# Patient Record
Sex: Male | Born: 1950 | Hispanic: No
Health system: Southern US, Community
[De-identification: ages and names within clinical notes are randomized; demographics above are authoritative.]

---

## 2016-08-14 ENCOUNTER — Inpatient Hospital Stay
Admission: AD | Admit: 2016-08-14 | Discharge: 2016-10-29 | Disposition: E | Payer: Self-pay | Source: Ambulatory Visit | Attending: Internal Medicine | Admitting: Internal Medicine

## 2016-08-14 ENCOUNTER — Other Ambulatory Visit (HOSPITAL_COMMUNITY): Payer: Self-pay

## 2016-08-14 DIAGNOSIS — Z4659 Encounter for fitting and adjustment of other gastrointestinal appliance and device: Secondary | ICD-10-CM

## 2016-08-14 DIAGNOSIS — Z9889 Other specified postprocedural states: Secondary | ICD-10-CM

## 2016-08-14 DIAGNOSIS — R1310 Dysphagia, unspecified: Secondary | ICD-10-CM

## 2016-08-14 DIAGNOSIS — Z931 Gastrostomy status: Secondary | ICD-10-CM

## 2016-08-14 DIAGNOSIS — J9690 Respiratory failure, unspecified, unspecified whether with hypoxia or hypercapnia: Secondary | ICD-10-CM

## 2016-08-14 DIAGNOSIS — Z0189 Encounter for other specified special examinations: Secondary | ICD-10-CM

## 2016-08-14 DIAGNOSIS — J189 Pneumonia, unspecified organism: Secondary | ICD-10-CM

## 2016-08-14 DIAGNOSIS — T17908A Unspecified foreign body in respiratory tract, part unspecified causing other injury, initial encounter: Secondary | ICD-10-CM

## 2016-08-14 DIAGNOSIS — J90 Pleural effusion, not elsewhere classified: Secondary | ICD-10-CM

## 2016-08-14 DIAGNOSIS — S27392A Other injuries of lung, bilateral, initial encounter: Secondary | ICD-10-CM

## 2016-08-14 LAB — BLOOD GAS, ARTERIAL
Acid-Base Excess: 0.2 mmol/L (ref 0.0–2.0)
Bicarbonate: 23.5 mmol/L (ref 20.0–28.0)
FIO2: 40
O2 SAT: 90.7 %
PEEP: 5 cmH2O
PH ART: 7.469 — AB (ref 7.350–7.450)
Patient temperature: 98.6
Pressure support: 15 cmH2O
RATE: 22 resp/min
pCO2 arterial: 32.8 mmHg (ref 32.0–48.0)
pO2, Arterial: 57.3 mmHg — ABNORMAL LOW (ref 83.0–108.0)

## 2016-08-15 LAB — COMPREHENSIVE METABOLIC PANEL
ALT: 27 U/L (ref 17–63)
AST: 51 U/L — AB (ref 15–41)
Albumin: 1.9 g/dL — ABNORMAL LOW (ref 3.5–5.0)
Alkaline Phosphatase: 98 U/L (ref 38–126)
Anion gap: 5 (ref 5–15)
BUN: 25 mg/dL — AB (ref 6–20)
CHLORIDE: 116 mmol/L — AB (ref 101–111)
CO2: 22 mmol/L (ref 22–32)
CREATININE: 0.64 mg/dL (ref 0.61–1.24)
Calcium: 9.2 mg/dL (ref 8.9–10.3)
GFR calc non Af Amer: >60 mL/min (ref >60)
Glucose, Bld: 55 mg/dL — ABNORMAL LOW (ref 65–99)
POTASSIUM: 4.8 mmol/L (ref 3.5–5.1)
SODIUM: 143 mmol/L (ref 135–145)
Total Bilirubin: 4.2 mg/dL — ABNORMAL HIGH (ref 0.3–1.2)
Total Protein: 5.8 g/dL — ABNORMAL LOW (ref 6.5–8.1)

## 2016-08-15 LAB — CBC
HEMATOCRIT: 35.5 % — AB (ref 39.0–52.0)
Hemoglobin: 11.3 g/dL — ABNORMAL LOW (ref 13.0–17.0)
MCH: 29.1 pg (ref 26.0–34.0)
MCHC: 31.8 g/dL (ref 30.0–36.0)
MCV: 91.5 fL (ref 78.0–100.0)
PLATELETS: 29 10*3/uL — AB (ref 150–400)
RBC: 3.88 MIL/uL — AB (ref 4.22–5.81)
RDW: 21.4 % — ABNORMAL HIGH (ref 11.5–15.5)
WBC: 9.1 10*3/uL (ref 4.0–10.5)

## 2016-08-17 ENCOUNTER — Other Ambulatory Visit (HOSPITAL_COMMUNITY): Payer: Self-pay

## 2016-08-17 LAB — CBC WITH DIFFERENTIAL/PLATELET
BASOS ABS: 0 10*3/uL (ref 0.0–0.1)
Basophils Relative: 0 %
EOS ABS: 0.3 10*3/uL (ref 0.0–0.7)
Eosinophils Relative: 3 %
HCT: 33.9 % — ABNORMAL LOW (ref 39.0–52.0)
HEMOGLOBIN: 11.1 g/dL — AB (ref 13.0–17.0)
LYMPHS PCT: 9 %
Lymphs Abs: 0.9 10*3/uL (ref 0.7–4.0)
MCH: 30.2 pg (ref 26.0–34.0)
MCHC: 32.7 g/dL (ref 30.0–36.0)
MCV: 92.4 fL (ref 78.0–100.0)
MONOS PCT: 19 %
Monocytes Absolute: 1.9 10*3/uL — ABNORMAL HIGH (ref 0.1–1.0)
NEUTROS ABS: 6.8 10*3/uL (ref 1.7–7.7)
Neutrophils Relative %: 69 %
Platelets: 40 10*3/uL — ABNORMAL LOW (ref 150–400)
RBC: 3.67 MIL/uL — AB (ref 4.22–5.81)
RDW: 22.3 % — AB (ref 11.5–15.5)
WBC: 9.9 10*3/uL (ref 4.0–10.5)

## 2016-08-17 LAB — CULTURE, RESPIRATORY

## 2016-08-17 LAB — CULTURE, RESPIRATORY W GRAM STAIN

## 2016-08-18 LAB — BLOOD GAS, ARTERIAL
ACID-BASE DEFICIT: 3.6 mmol/L — AB (ref 0.0–2.0)
BICARBONATE: 21.4 mmol/L (ref 20.0–28.0)
FIO2: 40
LHR: 14 {breaths}/min
MECHVT: 450 mL
O2 SAT: 97.4 %
PATIENT TEMPERATURE: 98.1
PCO2 ART: 41.6 mmHg (ref 32.0–48.0)
PEEP/CPAP: 5 cmH2O
PH ART: 7.331 — AB (ref 7.350–7.450)
PO2 ART: 98.6 mmHg (ref 83.0–108.0)

## 2016-08-19 MED FILL — Medication: Qty: 1 | Status: AC

## 2016-08-21 LAB — COMPREHENSIVE METABOLIC PANEL
ALT: 47 U/L (ref 17–63)
ANION GAP: 6 (ref 5–15)
AST: 112 U/L — ABNORMAL HIGH (ref 15–41)
Albumin: 1.6 g/dL — ABNORMAL LOW (ref 3.5–5.0)
Alkaline Phosphatase: 162 U/L — ABNORMAL HIGH (ref 38–126)
BUN: 23 mg/dL — ABNORMAL HIGH (ref 6–20)
CO2: 22 mmol/L (ref 22–32)
Calcium: 9.2 mg/dL (ref 8.9–10.3)
Chloride: 110 mmol/L (ref 101–111)
Creatinine, Ser: 0.59 mg/dL — ABNORMAL LOW (ref 0.61–1.24)
GFR calc Af Amer: >60 mL/min (ref >60)
GFR calc non Af Amer: >60 mL/min (ref >60)
Glucose, Bld: 91 mg/dL (ref 65–99)
POTASSIUM: 4.7 mmol/L (ref 3.5–5.1)
SODIUM: 138 mmol/L (ref 135–145)
TOTAL PROTEIN: 6.3 g/dL — AB (ref 6.5–8.1)
Total Bilirubin: 5.3 mg/dL — ABNORMAL HIGH (ref 0.3–1.2)

## 2016-08-21 LAB — CBC
HCT: 29.3 % — ABNORMAL LOW (ref 39.0–52.0)
Hemoglobin: 9.9 g/dL — ABNORMAL LOW (ref 13.0–17.0)
MCH: 30.7 pg (ref 26.0–34.0)
MCHC: 33.8 g/dL (ref 30.0–36.0)
MCV: 91 fL (ref 78.0–100.0)
Platelets: 50 10*3/uL — ABNORMAL LOW (ref 150–400)
RBC: 3.22 MIL/uL — ABNORMAL LOW (ref 4.22–5.81)
RDW: 22.3 % — AB (ref 11.5–15.5)
WBC: 11 10*3/uL — ABNORMAL HIGH (ref 4.0–10.5)

## 2016-08-21 LAB — AMMONIA: Ammonia: 69 umol/L — ABNORMAL HIGH (ref 9–35)

## 2016-08-24 DIAGNOSIS — K921 Melena: Secondary | ICD-10-CM

## 2016-08-24 DIAGNOSIS — D62 Acute posthemorrhagic anemia: Secondary | ICD-10-CM

## 2016-08-24 DIAGNOSIS — K7469 Other cirrhosis of liver: Secondary | ICD-10-CM

## 2016-08-24 LAB — CBC
HEMATOCRIT: 24.9 % — AB (ref 39.0–52.0)
HEMATOCRIT: 20 % — AB (ref 39.0–52.0)
Hemoglobin: 8.5 g/dL — ABNORMAL LOW (ref 13.0–17.0)
Hemoglobin: 6.9 g/dL — CL (ref 13.0–17.0)
MCH: 30.6 pg (ref 26.0–34.0)
MCH: 30.4 pg (ref 26.0–34.0)
MCHC: 34.1 g/dL (ref 30.0–36.0)
MCHC: 34.5 g/dL (ref 30.0–36.0)
MCV: 89.6 fL (ref 78.0–100.0)
MCV: 88.1 fL (ref 78.0–100.0)
PLATELETS: 79 10*3/uL — AB (ref 150–400)
PLATELETS: 100 10*3/uL — AB (ref 150–400)
RBC: 2.78 MIL/uL — AB (ref 4.22–5.81)
RBC: 2.27 MIL/uL — ABNORMAL LOW (ref 4.22–5.81)
RDW: 23 % — ABNORMAL HIGH (ref 11.5–15.5)
RDW: 22.9 % — AB (ref 11.5–15.5)
WBC: 10.8 10*3/uL — ABNORMAL HIGH (ref 4.0–10.5)
WBC: 10.7 10*3/uL — ABNORMAL HIGH (ref 4.0–10.5)

## 2016-08-24 LAB — PREPARE RBC (CROSSMATCH)

## 2016-08-24 LAB — ABO/RH: ABO/RH(D): O POS

## 2016-08-24 NOTE — Consult Note (Signed)
[facility] Gastroenterology Consult Note   History VAHE PIENTA MRN # [id number]  Date of Admission: 08/23/2016 Date of Consultation: 08/24/2016 Referring physician: Dr. Carron Curie, MD  Reason for Consultation/Chief Complaint: Bleeding  Subjective  HPI:  This is a complicated 66 year old man recently [facility] on 08/06/2016. I received a message through our answering service at 1050 this morning And saw the patient at 1110 this morning. I spoke extensively with Dr. Odis Luster, the attending physician and [facility] was just coming on service today. It appears that the telemedicine coverage for their group was called at about 5:30 this morning because the patient had rectal bleeding. That physician apparently ordered a consult placed to GI for this bleeding, but again the message came to me at the above-noted time. Dr. Odis Luster was unaware that the patient had had any bleeding and had not received sign off from the overnight physician. In fact, Dr. Odis Luster and rounded on the patient an hour before at which time there was apparently no active bleeding and his morning hemoglobin had been 8.5. The patient's medical records are extensive and also in a paper chart since that clinic is not on the Epic system. 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 He remained critically ill postop and then developed profuse bleeding from cirrhosis related internal hemorrhoids. It could not be controlled with endoscopic management, nor with surgical ligation, thus he underwent TIPS placement. It is not clear for how long there was control of the bleeding, but he apparently required further treatment  by interventional radiology with some unclear reports of interventions taken on branches of the iliac vessels or hemorrhoidal plexus.  Near as I can tell, the patient had not had recurrent bleeding from [facility] until early this morning as noted above.  When I evaluated the patient, he is in distress with altered mental status, tachycardic, he is agitated confused and can give no helpful history or review of systems. There is a large amount of fresh and clotted blood passing around his rectal tube.  [facility] that this patient has been deemed to have a "grave prognosis". Dr. Odis Luster tells me that there are reports his family may be unrealistic about expectations regarding his outcome.  ROS:  Unable to be obtained due to the patient's mental status.  Past Medical History No past medical history on file. Hepatic cirrhosis, cause unclear from the limited information available at this time. Past Surgical History No past surgical history on file. Procedure as noted above Family History No family history on file. Cannot be assessed at this time due to patient's mental status Social History Social History   Social History  . Marital status: Divorced    Spouse name: N/A  . Number of children: N/A  . Years of education: N/A   Social History Main Topics  . Smoking status: Not on file  . Smokeless tobacco: Not on file  . Alcohol use Not on file  . Drug use: Unknown  . Sexual activity: Not on file   Other Topics Concern  . Not on file   Social History Narrative  . No narrative on file  Allergies Allergies not on file  Outpatient Meds Home medications from the H+P and/or nursing med reconciliation reviewed.  Inpatient med list reviewed His admission medications were Aldactone 50 mg daily, Lasix 20 g daily, Risperdal 1 mg twice daily thiamine 100 mg daily,  Protonix 40 mg daily oxycodone IR 5 mg twice daily, lactulose 10 g per his NG tube once daily Haldol as needed, folic acid a milligram a day _____________________________________________________________________ Objective   Exam:  Current vital signs  No data found.  No intake or output data in the 24 hours ending 08/24/16 1613 His pulse was 105. nursing had not yet taken his blood pressure since my arrival.  Physical Exam:    General: this is a confused, combative, febrile and emaciated male patient with a Dobbhoff tube in place  Eyes: sclera icteric, no redness  ENT: oral mucosa moist without lesions, no cervical or supraclavicular lymphadenopathy, poor dentition  CV: RRR without murmur, S1/S2, no JVD,, no peripheral edema  Resp: He is mildly to, he is not cooperative with exam. He seems to be moving air well.  GI: soft, no apparent tenderness, with active bowel sounds. No guarding or palpable organomegaly noted  Skin; warm and dry, no rash noted. He seems to be slightly jaundiced  Neuro: Moves all his extremities, otherwise cannot dissipate and neuro exam. He is confused.. A rectal tube is in place with maroon material in the collection bag in the tube. He has a large amount of clotted blood having passed around the tube.  Labs:   Recent Labs Lab 08/21/16 0706 08/24/16 0639 08/24/16 1220  WBC 11.0* 10.8* 10.7*  HGB 9.9* 8.5* 6.9*  HCT 29.3* 24.9* 20.0*  PLT 50* 79* 100*    Recent Labs Lab 08/21/16 0706  NA 138  K 4.7  CL 110  CO2 22  BUN 23*  ALBUMIN 1.6*  ALKPHOS 162*  ALT 47  AST 112*  GLUCOSE 91   No results for input(s): INR in the last 168 hours.  @ASSESSMENTPLANBEGIN @ Impression:  Brisk lower GI bleeding, most likely recurrent internal hemorrhoidal bleeding based on his history Cirrhosis Protein calorie malnutrition Altered mental status  This patient is clinically decompensating. Dr. Odis LusterBowers was brought in to reevaluate the patient, and  my advice was to call the family and suggest that he be comfort care. As near as I can tell from the available reports, endoscopic therapy was unsuccessful when this bleeding occurred during the outside hospitalization, and have no expectation and will be at this point. This patient does indeed seem to have a grave prognosis and I do not think further procedures would be the right thing for him.  Plan:  [facility] make a decision on goals of care. If they are insistent that he be given further aggressive management, then he needs to be transferred immediately [facility].  We will not be planning to follow this patient as I  feel we have nothing further to offer him.  Thank you for the courtesy of this consult.  Please contact me with any questions or concerns.  Charlie PitterHenry L Danis III Pager: [phone] Mon-Fri 8a-5p [phone] after 5p, weekends, holidays

## 2016-08-25 LAB — CBC
HCT: 18.7 % — ABNORMAL LOW (ref 39.0–52.0)
HEMOGLOBIN: 6.5 g/dL — AB (ref 13.0–17.0)
MCH: 30 pg (ref 26.0–34.0)
MCHC: 34.8 g/dL (ref 30.0–36.0)
MCV: 86.2 fL (ref 78.0–100.0)
Platelets: 56 10*3/uL — ABNORMAL LOW (ref 150–400)
RBC: 2.17 MIL/uL — AB (ref 4.22–5.81)
RDW: 15.6 % — ABNORMAL HIGH (ref 11.5–15.5)
WBC: 11.5 10*3/uL — ABNORMAL HIGH (ref 4.0–10.5)

## 2016-08-25 LAB — PREPARE PLATELET PHERESIS: UNIT DIVISION: 0

## 2016-08-25 LAB — PREPARE FRESH FROZEN PLASMA: UNIT DIVISION: 0

## 2016-08-25 LAB — PREPARE RBC (CROSSMATCH)

## 2016-08-26 LAB — PREPARE PLATELET PHERESIS
UNIT DIVISION: 0
UNIT DIVISION: 0

## 2016-08-26 LAB — CBC
HCT: 22.2 % — ABNORMAL LOW (ref 39.0–52.0)
Hemoglobin: 7.7 g/dL — ABNORMAL LOW (ref 13.0–17.0)
MCH: 29.6 pg (ref 26.0–34.0)
MCHC: 34.7 g/dL (ref 30.0–36.0)
MCV: 85.4 fL (ref 78.0–100.0)
Platelets: 103 10*3/uL — ABNORMAL LOW (ref 150–400)
RBC: 2.6 MIL/uL — ABNORMAL LOW (ref 4.22–5.81)
RDW: 18.6 % — AB (ref 11.5–15.5)
WBC: 9.9 10*3/uL (ref 4.0–10.5)

## 2016-08-26 LAB — PREPARE FRESH FROZEN PLASMA
Unit division: 0
Unit division: 0
Unit division: 0

## 2016-08-28 LAB — TYPE AND SCREEN
ABO/RH(D): O POS
Antibody Screen: NEGATIVE
UNIT DIVISION: 0
UNIT DIVISION: 0
UNIT DIVISION: 0
UNIT DIVISION: 0
Unit division: 0
Unit division: 0
Unit division: 0
Unit division: 0

## 2016-08-28 LAB — BPAM RBC
BLOOD PRODUCT EXPIRATION DATE: 201803242359
BLOOD PRODUCT EXPIRATION DATE: 201803272359
Blood Product Expiration Date: 201803272359
Blood Product Expiration Date: 201803272359
Blood Product Expiration Date: 201803272359
Blood Product Expiration Date: 201803272359
Blood Product Expiration Date: 201803272359
Blood Product Expiration Date: 201803272359
ISSUE DATE / TIME: 201802241546
ISSUE DATE / TIME: 201802250222
ISSUE DATE / TIME: 201802250332
ISSUE DATE / TIME: 201802250710
ISSUE DATE / TIME: 201802251555
UNIT TYPE AND RH: 5100
UNIT TYPE AND RH: 5100
UNIT TYPE AND RH: 5100
Unit Type and Rh: 5100
Unit Type and Rh: 5100
Unit Type and Rh: 5100
Unit Type and Rh: 5100
Unit Type and Rh: 5100

## 2016-08-28 LAB — CBC
HCT: 21.2 % — ABNORMAL LOW (ref 39.0–52.0)
HEMOGLOBIN: 7.1 g/dL — AB (ref 13.0–17.0)
MCH: 30 pg (ref 26.0–34.0)
MCHC: 33.5 g/dL (ref 30.0–36.0)
MCV: 89.5 fL (ref 78.0–100.0)
PLATELETS: 108 10*3/uL — AB (ref 150–400)
RBC: 2.37 MIL/uL — AB (ref 4.22–5.81)
RDW: 21 % — ABNORMAL HIGH (ref 11.5–15.5)
WBC: 9.7 10*3/uL (ref 4.0–10.5)

## 2016-08-28 LAB — BASIC METABOLIC PANEL
ANION GAP: 4 — AB (ref 5–15)
BUN: 24 mg/dL — ABNORMAL HIGH (ref 6–20)
CALCIUM: 8.6 mg/dL — AB (ref 8.9–10.3)
CO2: 19 mmol/L — ABNORMAL LOW (ref 22–32)
CREATININE: 0.72 mg/dL (ref 0.61–1.24)
Chloride: 116 mmol/L — ABNORMAL HIGH (ref 101–111)
Glucose, Bld: 94 mg/dL (ref 65–99)
Potassium: 4.5 mmol/L (ref 3.5–5.1)
SODIUM: 139 mmol/L (ref 135–145)

## 2016-08-29 DEATH — deceased

## 2016-08-30 LAB — CBC WITH DIFFERENTIAL/PLATELET
BASOS ABS: 0 10*3/uL (ref 0.0–0.1)
Basophils Relative: 0 %
EOS PCT: 2 %
Eosinophils Absolute: 0.2 10*3/uL (ref 0.0–0.7)
HEMATOCRIT: 21.8 % — AB (ref 39.0–52.0)
HEMOGLOBIN: 7.2 g/dL — AB (ref 13.0–17.0)
Lymphocytes Relative: 16 %
Lymphs Abs: 1.5 10*3/uL (ref 0.7–4.0)
MCH: 30.1 pg (ref 26.0–34.0)
MCHC: 33 g/dL (ref 30.0–36.0)
MCV: 91.2 fL (ref 78.0–100.0)
MONO ABS: 1.5 10*3/uL — AB (ref 0.1–1.0)
MONOS PCT: 16 %
NEUTROS PCT: 66 %
Neutro Abs: 6.1 10*3/uL (ref 1.7–7.7)
Platelets: 99 10*3/uL — ABNORMAL LOW (ref 150–400)
RBC: 2.39 MIL/uL — AB (ref 4.22–5.81)
RDW: 22.9 % — ABNORMAL HIGH (ref 11.5–15.5)
WBC: 9.3 10*3/uL (ref 4.0–10.5)

## 2016-08-30 LAB — COMPREHENSIVE METABOLIC PANEL
ALT: 30 U/L (ref 17–63)
ANION GAP: 4 — AB (ref 5–15)
AST: 55 U/L — ABNORMAL HIGH (ref 15–41)
Albumin: 1.7 g/dL — ABNORMAL LOW (ref 3.5–5.0)
Alkaline Phosphatase: 106 U/L (ref 38–126)
BUN: 21 mg/dL — ABNORMAL HIGH (ref 6–20)
CHLORIDE: 115 mmol/L — AB (ref 101–111)
CO2: 19 mmol/L — ABNORMAL LOW (ref 22–32)
Calcium: 8.9 mg/dL (ref 8.9–10.3)
Creatinine, Ser: 0.67 mg/dL (ref 0.61–1.24)
Glucose, Bld: 83 mg/dL (ref 65–99)
POTASSIUM: 4 mmol/L (ref 3.5–5.1)
SODIUM: 138 mmol/L (ref 135–145)
Total Bilirubin: 2.8 mg/dL — ABNORMAL HIGH (ref 0.3–1.2)
Total Protein: 5.6 g/dL — ABNORMAL LOW (ref 6.5–8.1)

## 2016-08-30 LAB — AMMONIA: AMMONIA: 70 umol/L — AB (ref 9–35)

## 2016-09-06 LAB — COMPREHENSIVE METABOLIC PANEL
ALBUMIN: 1.3 g/dL — AB (ref 3.5–5.0)
ALT: 26 U/L (ref 17–63)
ANION GAP: 5 (ref 5–15)
AST: 54 U/L — AB (ref 15–41)
Alkaline Phosphatase: 139 U/L — ABNORMAL HIGH (ref 38–126)
BILIRUBIN TOTAL: 2.1 mg/dL — AB (ref 0.3–1.2)
BUN: 18 mg/dL (ref 6–20)
CO2: 17 mmol/L — ABNORMAL LOW (ref 22–32)
Calcium: 8.4 mg/dL — ABNORMAL LOW (ref 8.9–10.3)
Chloride: 117 mmol/L — ABNORMAL HIGH (ref 101–111)
Creatinine, Ser: 0.52 mg/dL — ABNORMAL LOW (ref 0.61–1.24)
GFR calc Af Amer: >60 mL/min (ref >60)
GLUCOSE: 78 mg/dL (ref 65–99)
POTASSIUM: 4.4 mmol/L (ref 3.5–5.1)
Sodium: 139 mmol/L (ref 135–145)
TOTAL PROTEIN: 5.7 g/dL — AB (ref 6.5–8.1)

## 2016-09-06 LAB — AMMONIA: AMMONIA: 50 umol/L — AB (ref 9–35)

## 2016-09-09 ENCOUNTER — Other Ambulatory Visit (HOSPITAL_COMMUNITY): Payer: Self-pay

## 2016-09-09 LAB — CBC
HCT: 23 % — ABNORMAL LOW (ref 39.0–52.0)
HEMOGLOBIN: 7.7 g/dL — AB (ref 13.0–17.0)
MCH: 30.6 pg (ref 26.0–34.0)
MCHC: 33.5 g/dL (ref 30.0–36.0)
MCV: 91.3 fL (ref 78.0–100.0)
Platelets: 101 10*3/uL — ABNORMAL LOW (ref 150–400)
RBC: 2.52 MIL/uL — AB (ref 4.22–5.81)
RDW: 24.4 % — ABNORMAL HIGH (ref 11.5–15.5)
WBC: 13.7 10*3/uL — ABNORMAL HIGH (ref 4.0–10.5)

## 2016-09-11 ENCOUNTER — Other Ambulatory Visit (HOSPITAL_COMMUNITY): Payer: Self-pay

## 2016-09-11 LAB — PROTIME-INR
INR: 1.96
Prothrombin Time: 22.6 seconds — ABNORMAL HIGH (ref 11.4–15.2)

## 2016-09-11 MED ORDER — LIDOCAINE HCL 1 % IJ SOLN
INTRAMUSCULAR | Status: AC
  Filled 2016-09-11: qty 20

## 2016-09-11 NOTE — Procedures (Signed)
PROCEDURE SUMMARY:  Successful US guided therapeutic right thoracentesis. Yielded 1.5L of amber fluid. Pt tolerated procedure well. No immediate complications.     Specimen was not sent for labs. CXR ordered.  Hoyt KochKacie Sue-Ellen Sonny Poth PA-C 09/11/2016 3:37 PM

## 2016-09-12 ENCOUNTER — Other Ambulatory Visit (HOSPITAL_COMMUNITY): Payer: Self-pay

## 2016-09-12 NOTE — Progress Notes (Addendum)
Per reading of Dr. Grace IsaacWatts' CT report, the patient is not a candidate for percutaneous g-tube placement given the moderate ascites present from cirrhosis.  We would recommend surgical consultation for g-tube placement if still desired.  This was relayed to the patient's nurse as none of the providers have arrived at this time.  Chinenye Katzenberger E 7:24 AM 09/12/2016

## 2016-09-14 ENCOUNTER — Other Ambulatory Visit (HOSPITAL_COMMUNITY): Payer: Self-pay

## 2016-09-14 LAB — COMPREHENSIVE METABOLIC PANEL
ALK PHOS: 154 U/L — AB (ref 38–126)
ALT: 24 U/L (ref 17–63)
AST: 51 U/L — AB (ref 15–41)
Albumin: 1.3 g/dL — ABNORMAL LOW (ref 3.5–5.0)
Anion gap: 6 (ref 5–15)
BILIRUBIN TOTAL: 1.6 mg/dL — AB (ref 0.3–1.2)
BUN: 20 mg/dL (ref 6–20)
CHLORIDE: 115 mmol/L — AB (ref 101–111)
CO2: 19 mmol/L — AB (ref 22–32)
Calcium: 8.7 mg/dL — ABNORMAL LOW (ref 8.9–10.3)
Creatinine, Ser: 0.56 mg/dL — ABNORMAL LOW (ref 0.61–1.24)
GLUCOSE: 89 mg/dL (ref 65–99)
POTASSIUM: 4.5 mmol/L (ref 3.5–5.1)
Sodium: 140 mmol/L (ref 135–145)
Total Protein: 6.4 g/dL — ABNORMAL LOW (ref 6.5–8.1)

## 2016-09-14 LAB — CBC
HEMATOCRIT: 22.5 % — AB (ref 39.0–52.0)
Hemoglobin: 7.6 g/dL — ABNORMAL LOW (ref 13.0–17.0)
MCH: 31.9 pg (ref 26.0–34.0)
MCHC: 33.8 g/dL (ref 30.0–36.0)
MCV: 94.5 fL (ref 78.0–100.0)
Platelets: 97 10*3/uL — ABNORMAL LOW (ref 150–400)
RBC: 2.38 MIL/uL — AB (ref 4.22–5.81)
RDW: 26.2 % — ABNORMAL HIGH (ref 11.5–15.5)
WBC: 16.4 10*3/uL — AB (ref 4.0–10.5)

## 2016-09-15 LAB — URINALYSIS, ROUTINE W REFLEX MICROSCOPIC
Bilirubin Urine: NEGATIVE
Glucose, UA: NEGATIVE mg/dL
Hgb urine dipstick: NEGATIVE
Ketones, ur: NEGATIVE mg/dL
LEUKOCYTES UA: NEGATIVE
NITRITE: NEGATIVE
PH: 7 (ref 5.0–8.0)
PROTEIN: NEGATIVE mg/dL
Specific Gravity, Urine: 1.005 (ref 1.005–1.030)

## 2016-09-17 ENCOUNTER — Other Ambulatory Visit (HOSPITAL_COMMUNITY): Payer: Self-pay

## 2016-09-17 LAB — URINE CULTURE: Culture: NO GROWTH

## 2016-09-19 LAB — CULTURE, BLOOD (ROUTINE X 2)
Culture: NO GROWTH
Culture: NO GROWTH

## 2016-09-19 LAB — CULTURE, RESPIRATORY

## 2016-09-19 LAB — CULTURE, RESPIRATORY W GRAM STAIN

## 2016-09-20 LAB — TSH: TSH: 16.71 u[IU]/mL — AB (ref 0.350–4.500)

## 2016-09-26 ENCOUNTER — Other Ambulatory Visit (HOSPITAL_COMMUNITY): Payer: Self-pay

## 2016-09-26 LAB — URINALYSIS, ROUTINE W REFLEX MICROSCOPIC
Bilirubin Urine: NEGATIVE
Glucose, UA: NEGATIVE mg/dL
HGB URINE DIPSTICK: NEGATIVE
Ketones, ur: NEGATIVE mg/dL
Leukocytes, UA: NEGATIVE
Nitrite: NEGATIVE
PROTEIN: NEGATIVE mg/dL
Specific Gravity, Urine: 1.012 (ref 1.005–1.030)
pH: 7 (ref 5.0–8.0)

## 2016-09-26 LAB — CBC
HCT: 23.4 % — ABNORMAL LOW (ref 39.0–52.0)
Hemoglobin: 7.6 g/dL — ABNORMAL LOW (ref 13.0–17.0)
MCH: 32.3 pg (ref 26.0–34.0)
MCHC: 32.5 g/dL (ref 30.0–36.0)
MCV: 99.6 fL (ref 78.0–100.0)
PLATELETS: 75 10*3/uL — AB (ref 150–400)
RBC: 2.35 MIL/uL — AB (ref 4.22–5.81)
RDW: 27.4 % — ABNORMAL HIGH (ref 11.5–15.5)
WBC: 17.7 10*3/uL — AB (ref 4.0–10.5)

## 2016-09-26 LAB — BASIC METABOLIC PANEL
ANION GAP: 8 (ref 5–15)
BUN: 37 mg/dL — AB (ref 6–20)
CO2: 23 mmol/L (ref 22–32)
Calcium: 10.1 mg/dL (ref 8.9–10.3)
Chloride: 123 mmol/L — ABNORMAL HIGH (ref 101–111)
Creatinine, Ser: 0.76 mg/dL (ref 0.61–1.24)
GFR calc Af Amer: >60 mL/min (ref >60)
Glucose, Bld: 119 mg/dL — ABNORMAL HIGH (ref 65–99)
POTASSIUM: 2.6 mmol/L — AB (ref 3.5–5.1)
SODIUM: 154 mmol/L — AB (ref 135–145)

## 2016-09-28 ENCOUNTER — Other Ambulatory Visit (HOSPITAL_COMMUNITY): Payer: Self-pay

## 2016-09-28 LAB — BASIC METABOLIC PANEL
Anion gap: 5 (ref 5–15)
BUN: 42 mg/dL — AB (ref 6–20)
BUN: 42 mg/dL — ABNORMAL HIGH (ref 6–20)
CALCIUM: 9.5 mg/dL (ref 8.9–10.3)
CALCIUM: 9.2 mg/dL (ref 8.9–10.3)
CO2: 26 mmol/L (ref 22–32)
CO2: 23 mmol/L (ref 22–32)
Chloride: 123 mmol/L — ABNORMAL HIGH (ref 101–111)
Chloride: >130 mmol/L (ref 101–111)
Creatinine, Ser: 0.91 mg/dL (ref 0.61–1.24)
Creatinine, Ser: 0.98 mg/dL (ref 0.61–1.24)
GFR calc Af Amer: >60 mL/min (ref >60)
GFR calc non Af Amer: >60 mL/min (ref >60)
GFR calc non Af Amer: >60 mL/min (ref >60)
Glucose, Bld: 125 mg/dL — ABNORMAL HIGH (ref 65–99)
Glucose, Bld: 84 mg/dL (ref 65–99)
POTASSIUM: 2.8 mmol/L — AB (ref 3.5–5.1)
POTASSIUM: 4.2 mmol/L (ref 3.5–5.1)
SODIUM: 153 mmol/L — AB (ref 135–145)
Sodium: 154 mmol/L — ABNORMAL HIGH (ref 135–145)

## 2016-09-28 LAB — URINE CULTURE: Culture: >=100000 — AB

## 2016-09-28 LAB — MAGNESIUM: MAGNESIUM: 2 mg/dL (ref 1.7–2.4)

## 2016-09-29 LAB — CULTURE, RESPIRATORY W GRAM STAIN

## 2016-09-29 LAB — BASIC METABOLIC PANEL
ANION GAP: 6 (ref 5–15)
BUN: 43 mg/dL — ABNORMAL HIGH (ref 6–20)
CHLORIDE: 125 mmol/L — AB (ref 101–111)
CO2: 21 mmol/L — AB (ref 22–32)
Calcium: 9 mg/dL (ref 8.9–10.3)
Creatinine, Ser: 1.12 mg/dL (ref 0.61–1.24)
GFR calc Af Amer: >60 mL/min (ref >60)
GFR calc non Af Amer: >60 mL/min (ref >60)
GLUCOSE: 119 mg/dL — AB (ref 65–99)
POTASSIUM: 4.7 mmol/L (ref 3.5–5.1)
Sodium: 152 mmol/L — ABNORMAL HIGH (ref 135–145)

## 2016-09-29 LAB — CULTURE, RESPIRATORY

## 2016-09-30 ENCOUNTER — Other Ambulatory Visit (HOSPITAL_COMMUNITY): Payer: Self-pay

## 2016-09-30 LAB — BASIC METABOLIC PANEL
ANION GAP: 6 (ref 5–15)
BUN: 64 mg/dL — ABNORMAL HIGH (ref 6–20)
CALCIUM: 9.2 mg/dL (ref 8.9–10.3)
CO2: 19 mmol/L — ABNORMAL LOW (ref 22–32)
Chloride: 122 mmol/L — ABNORMAL HIGH (ref 101–111)
Creatinine, Ser: 1.48 mg/dL — ABNORMAL HIGH (ref 0.61–1.24)
GFR calc Af Amer: 55 mL/min — ABNORMAL LOW (ref >60)
GFR, EST NON AFRICAN AMERICAN: 48 mL/min — AB (ref >60)
GLUCOSE: 73 mg/dL (ref 65–99)
Potassium: 6 mmol/L — ABNORMAL HIGH (ref 3.5–5.1)
SODIUM: 147 mmol/L — AB (ref 135–145)

## 2016-09-30 LAB — BLOOD GAS, ARTERIAL
ACID-BASE DEFICIT: 5.7 mmol/L — AB (ref 0.0–2.0)
BICARBONATE: 21.6 mmol/L (ref 20.0–28.0)
FIO2: 100
O2 Saturation: 83.7 %
PCO2 ART: 60 mmHg — AB (ref 32.0–48.0)
PH ART: 7.176 — AB (ref 7.350–7.450)
Patient temperature: 96.9
pO2, Arterial: 53.7 mmHg — ABNORMAL LOW (ref 83.0–108.0)

## 2016-09-30 LAB — POTASSIUM: Potassium: 5.9 mmol/L — ABNORMAL HIGH (ref 3.5–5.1)

## 2016-10-29 DEATH — deceased

## 2018-11-04 IMAGING — DX DG ABD PORTABLE 1V
1 series · 1 of 1 positions shown · non-contrast
Comparison: None.

CLINICAL DATA: Nasogastric tube placement

EXAM:
PORTABLE ABDOMEN - 1 VIEW

[abdomen kub]
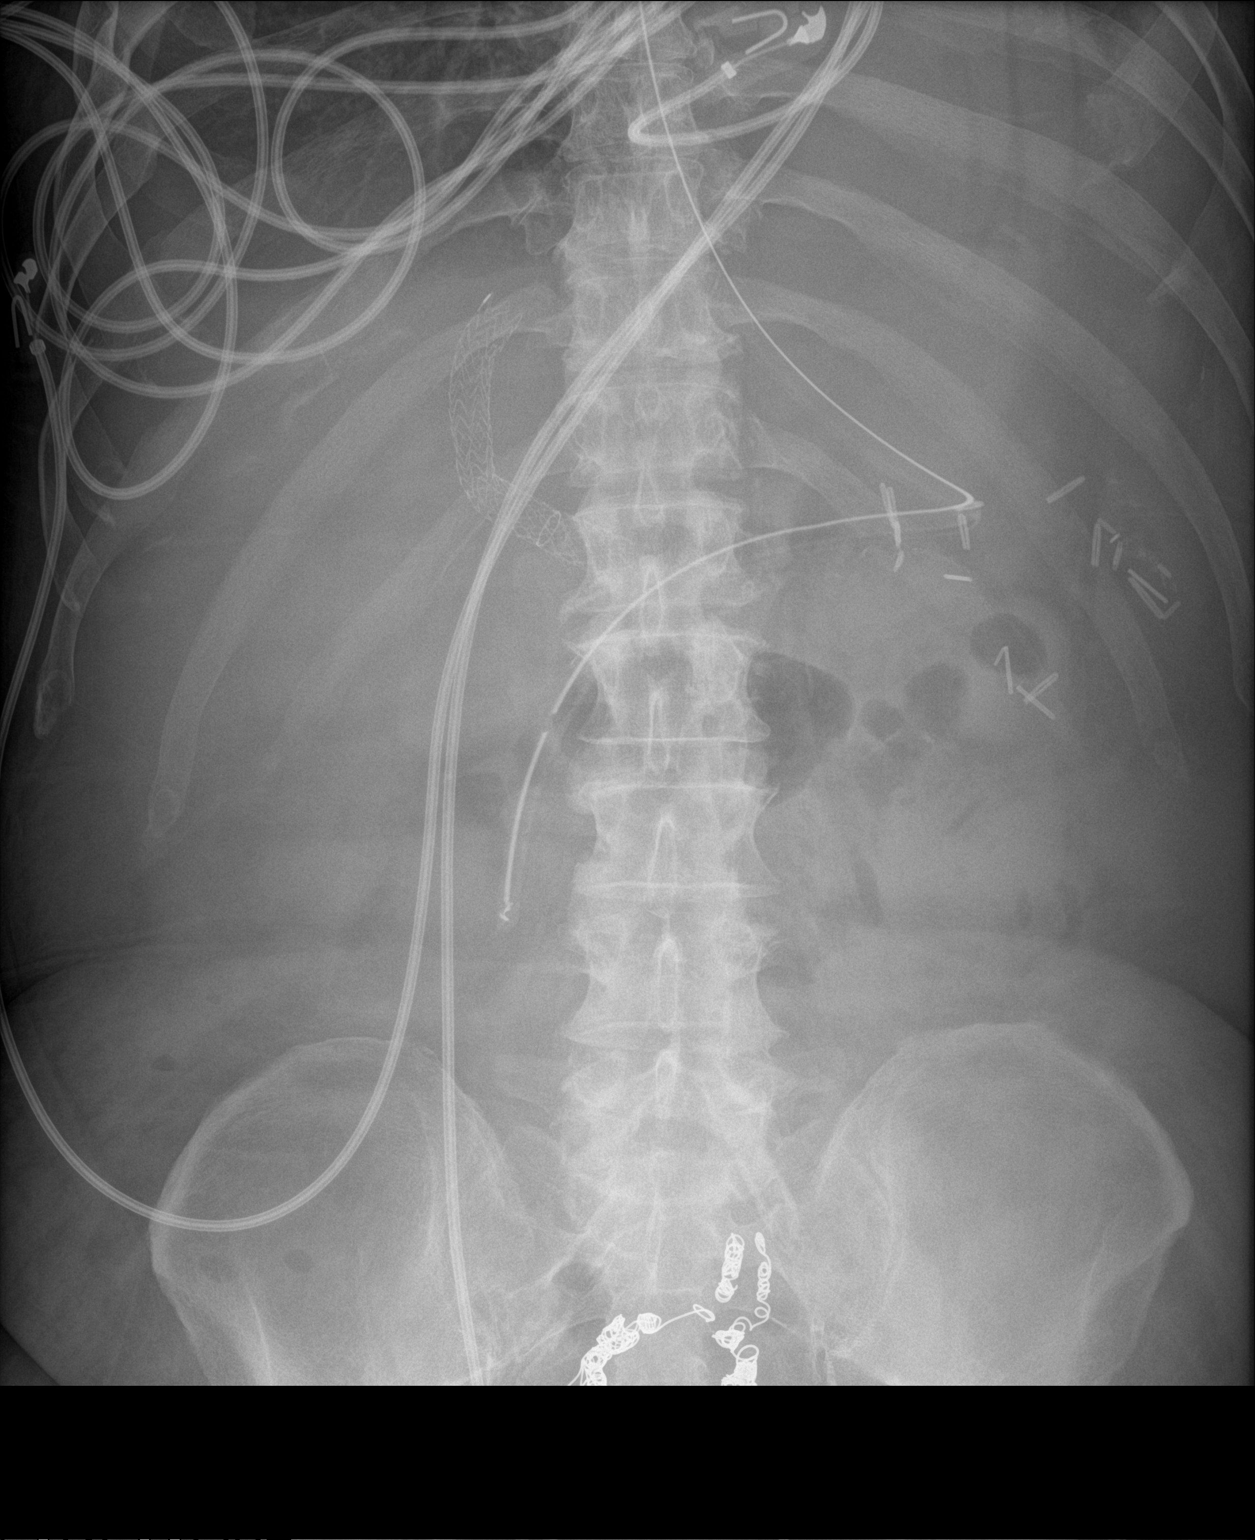

[1 of 1 positions shown; findings below may reference images not displayed]

FINDINGS: The nasogastric tube tip and side port are in the proximal duodenum.
There is a biliary stent in the right upper quadrant. There are
surgical clips throughout the abdomen and pelvis. There is no bowel
dilatation or air-fluid level suggesting bowel obstruction. No free
air. There is airspace consolidation in the left lower lobe.
IMPRESSION: Nasogastric tube tip and side port in proximal duodenum. Bowel gas
pattern unremarkable. Extensive postoperative change. Left lower
lobe airspace consolidation.

## 2018-11-04 IMAGING — DX DG CHEST 1V PORT
1 series · 1 of 1 positions shown · non-contrast
Comparison: None.

CLINICAL DATA: Status post hospital transfer. Assess tracheostomy
tube position. Initial encounter.

EXAM:
PORTABLE CHEST 1 VIEW

[chest ap]
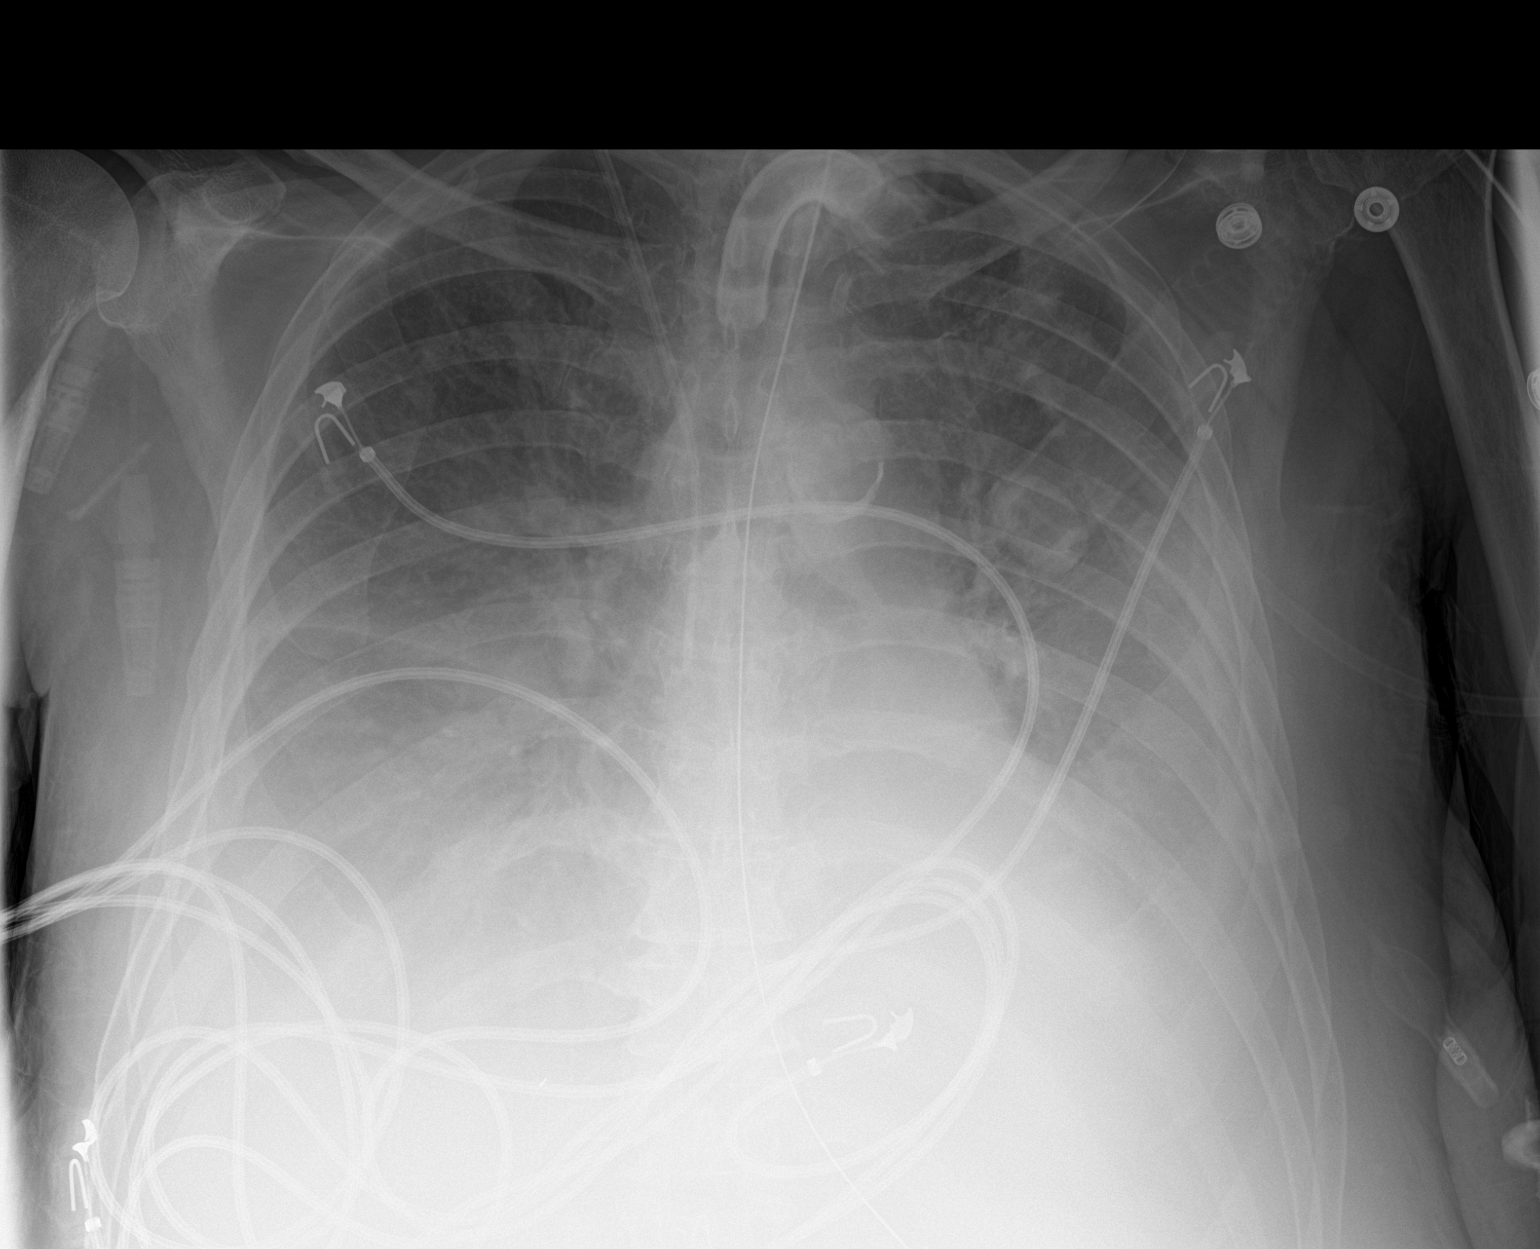

[1 of 1 positions shown; findings below may reference images not displayed]

FINDINGS: The patient's tracheostomy tube is seen ending 4-5 cm above the
carina. A right IJ line is noted ending about the mid SVC. An
enteric tube is noted extending below the diaphragm.

Small to moderate bilateral pleural effusions are noted. Vascular
congestion is seen. Increased interstitial markings raise concern
for pulmonary edema. No pneumothorax is seen.

The cardiomediastinal silhouette is normal in size. No acute osseous
abnormalities are identified.
IMPRESSION: 1. Tracheostomy tube seen ending 4-5 cm above the carina.
2. Small to moderate bilateral pleural effusions. Vascular
congestion is noted. Increased interstitial markings raise concern
for pulmonary edema.
